# Patient Record
Sex: Female | Born: 1945
Health system: Southern US, Community
[De-identification: ages and names within clinical notes are randomized; demographics above are authoritative.]

## PROBLEM LIST (undated history)

## (undated) HISTORY — PX: EYE SURGERY: SHX253

---

## 2004-06-22 ENCOUNTER — Ambulatory Visit: Payer: Self-pay | Admitting: Family Medicine

## 2013-10-25 ENCOUNTER — Emergency Department: Payer: Self-pay | Admitting: Emergency Medicine

## 2013-10-25 LAB — COMPREHENSIVE METABOLIC PANEL
ALK PHOS: 78 U/L
ALT: 16 U/L (ref 12–78)
Albumin: 3.7 g/dL (ref 3.4–5.0)
Anion Gap: 10 (ref 7–16)
BUN: 18 mg/dL (ref 7–18)
Bilirubin,Total: 0.4 mg/dL (ref 0.2–1.0)
CHLORIDE: 109 mmol/L — AB (ref 98–107)
CO2: 21 mmol/L (ref 21–32)
Calcium, Total: 8.7 mg/dL (ref 8.5–10.1)
Creatinine: 1.55 mg/dL — ABNORMAL HIGH (ref 0.60–1.30)
EGFR (African American): 40 — ABNORMAL LOW
EGFR (Non-African Amer.): 35 — ABNORMAL LOW
Glucose: 170 mg/dL — ABNORMAL HIGH (ref 65–99)
OSMOLALITY: 285 (ref 275–301)
POTASSIUM: 3.7 mmol/L (ref 3.5–5.1)
SGOT(AST): 13 U/L — ABNORMAL LOW (ref 15–37)
Sodium: 140 mmol/L (ref 136–145)
TOTAL PROTEIN: 7.3 g/dL (ref 6.4–8.2)

## 2013-10-25 LAB — URINALYSIS, COMPLETE
Bilirubin,UR: NEGATIVE
GLUCOSE, UR: NEGATIVE mg/dL (ref 0–75)
KETONE: NEGATIVE
Nitrite: POSITIVE
PH: 5 (ref 4.5–8.0)
Protein: NEGATIVE
RBC,UR: 447 /HPF (ref 0–5)
Specific Gravity: 1.011 (ref 1.003–1.030)
WBC UR: 64 /HPF (ref 0–5)

## 2013-10-25 LAB — CBC WITH DIFFERENTIAL/PLATELET
BASOS ABS: 0 10*3/uL (ref 0.0–0.1)
Basophil %: 0.7 %
Eosinophil #: 0 10*3/uL (ref 0.0–0.7)
Eosinophil %: 0.7 %
HCT: 41.2 % (ref 35.0–47.0)
HGB: 13.9 g/dL (ref 12.0–16.0)
Lymphocyte #: 0.6 10*3/uL — ABNORMAL LOW (ref 1.0–3.6)
Lymphocyte %: 15.3 %
MCH: 30.7 pg (ref 26.0–34.0)
MCHC: 33.7 g/dL (ref 32.0–36.0)
MCV: 91 fL (ref 80–100)
Monocyte #: 0 x10 3/mm — ABNORMAL LOW (ref 0.2–0.9)
Monocyte %: 0.5 %
NEUTROS PCT: 82.8 %
Neutrophil #: 3.5 10*3/uL (ref 1.4–6.5)
Platelet: 206 10*3/uL (ref 150–440)
RBC: 4.52 10*6/uL (ref 3.80–5.20)
RDW: 13.2 % (ref 11.5–14.5)
WBC: 4.2 10*3/uL (ref 3.6–11.0)

## 2013-10-25 LAB — TROPONIN I: Troponin-I: <0.02 ng/mL

## 2013-10-25 LAB — LIPASE, BLOOD: Lipase: 229 U/L (ref 73–393)

## 2014-10-31 DIAGNOSIS — I10 Essential (primary) hypertension: Secondary | ICD-10-CM | POA: Diagnosis not present

## 2014-10-31 DIAGNOSIS — E785 Hyperlipidemia, unspecified: Secondary | ICD-10-CM | POA: Diagnosis not present

## 2014-10-31 DIAGNOSIS — E559 Vitamin D deficiency, unspecified: Secondary | ICD-10-CM | POA: Diagnosis not present

## 2014-10-31 DIAGNOSIS — Z1239 Encounter for other screening for malignant neoplasm of breast: Secondary | ICD-10-CM | POA: Diagnosis not present

## 2014-12-03 IMAGING — CT CT STONE STUDY
2 of 4 series · 15 of 46 positions shown, 17 images · non-contrast
Comparison: None.

CLINICAL DATA: Left side pain, vomiting

EXAM:
CT ABDOMEN AND PELVIS WITHOUT CONTRAST
TECHNIQUE: Multidetector CT imaging of the abdomen and pelvis was performed
following the standard protocol without IV contrast.

[Series 2: stone standard full · axial · 0.67mm/px · z∈[-339,+61]mm · 12 of 96 slices shown, 14 images]
[im 8/96  soft-tissue]
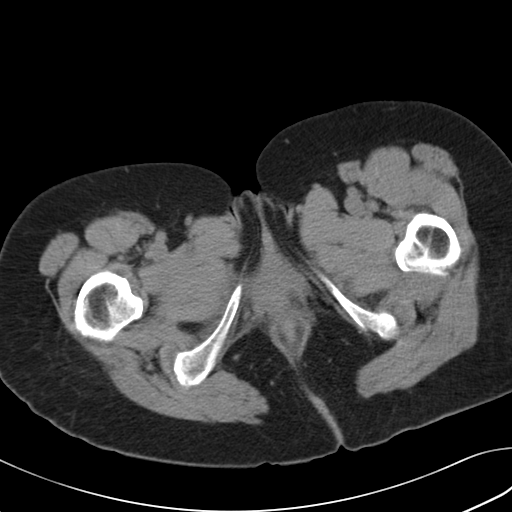
[im 8/96  bone]
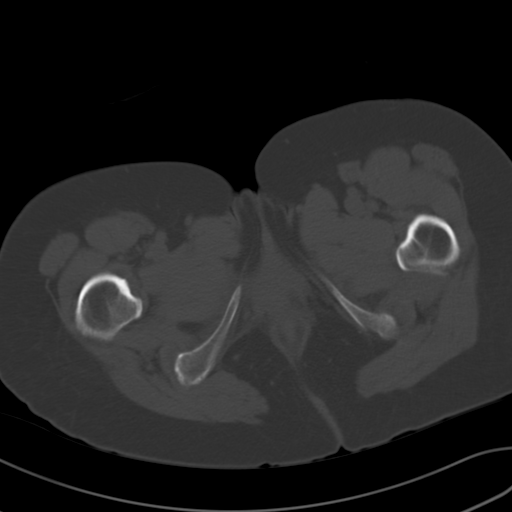
[im 15/96  soft-tissue]
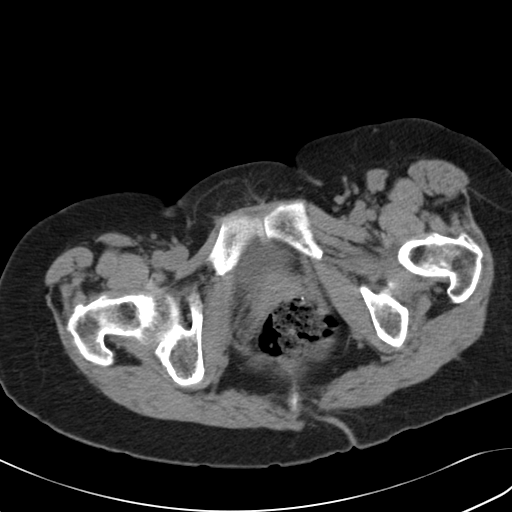
[im 22/96  soft-tissue]
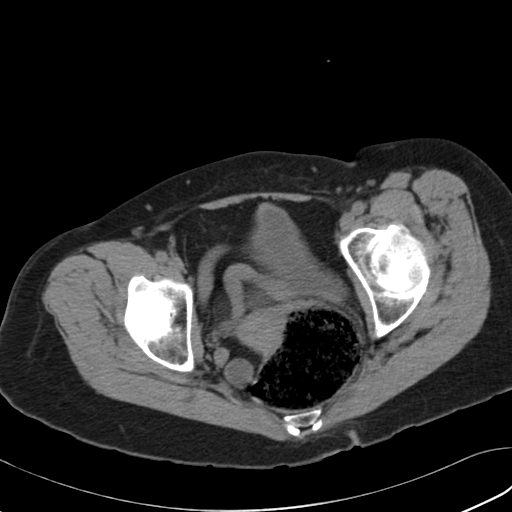
[im 30/96  soft-tissue]
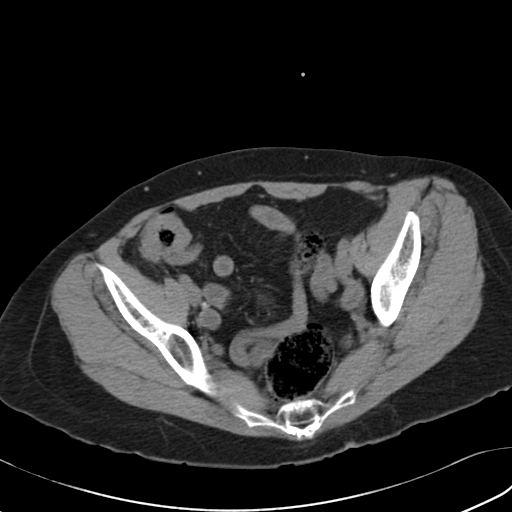
[im 37/96  soft-tissue]
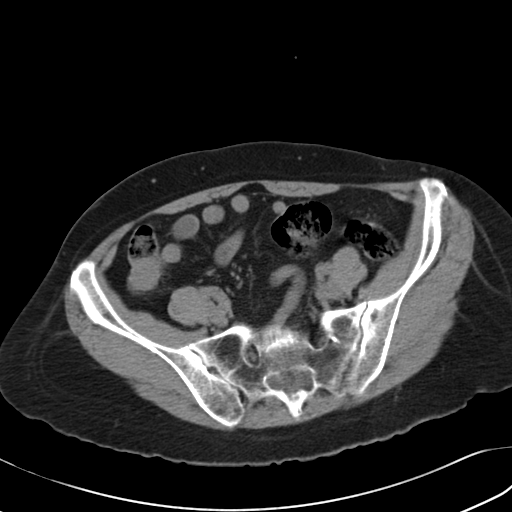
[im 44/96  soft-tissue]
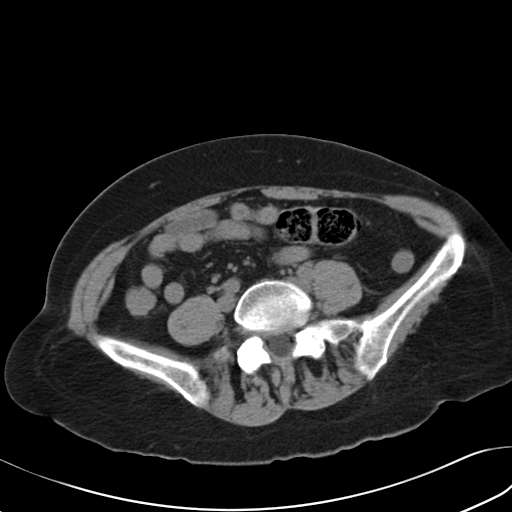
[im 52/96  soft-tissue]
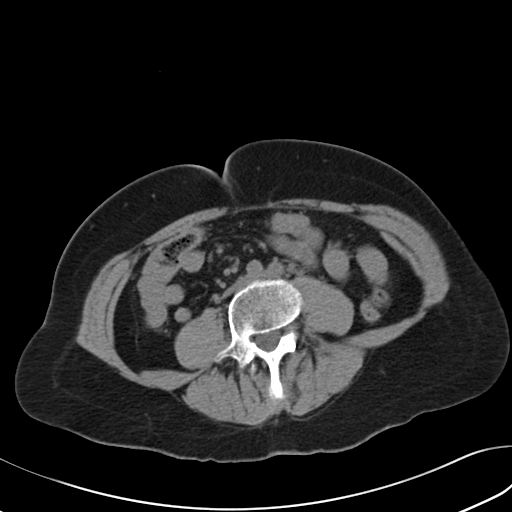
[im 59/96  soft-tissue]
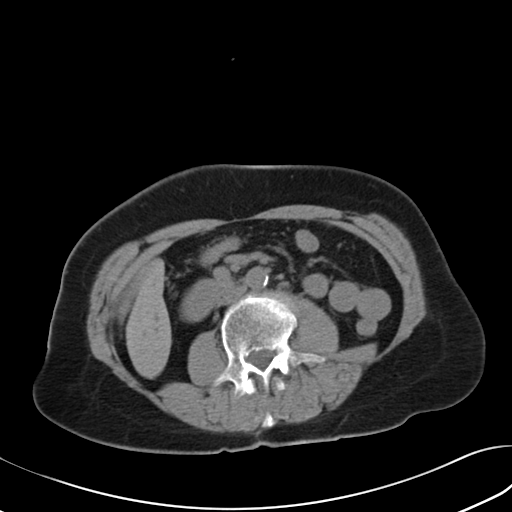
[im 66/96  soft-tissue]
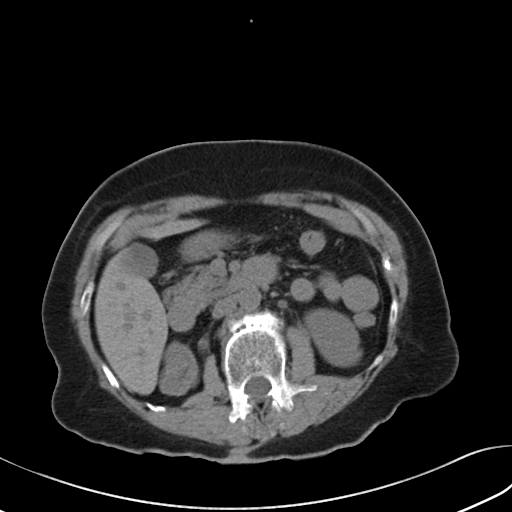
[im 66/96  bone]
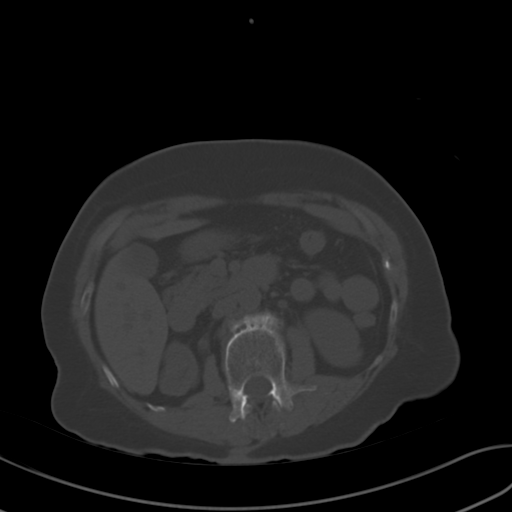
[im 74/96  soft-tissue]
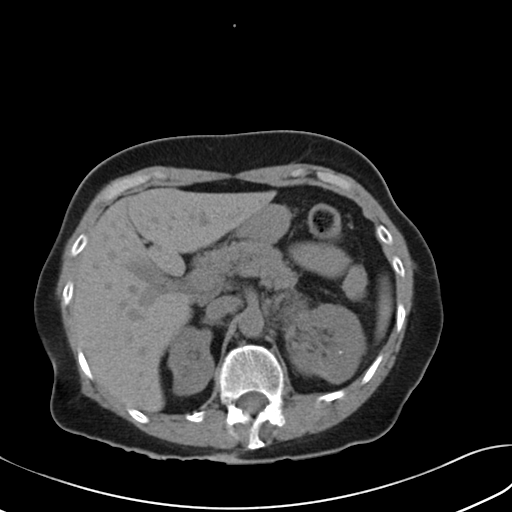
[im 81/96  soft-tissue]
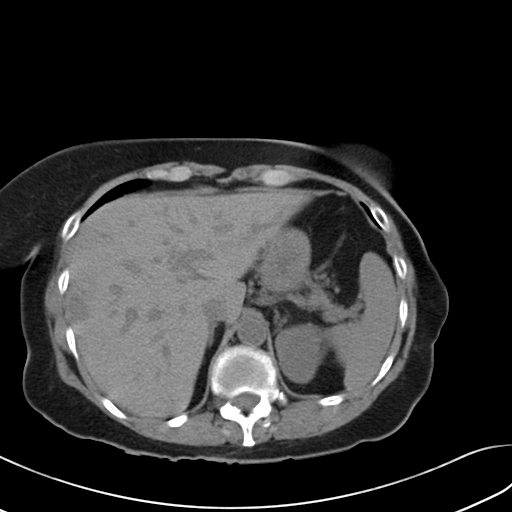
[im 88/96  soft-tissue]
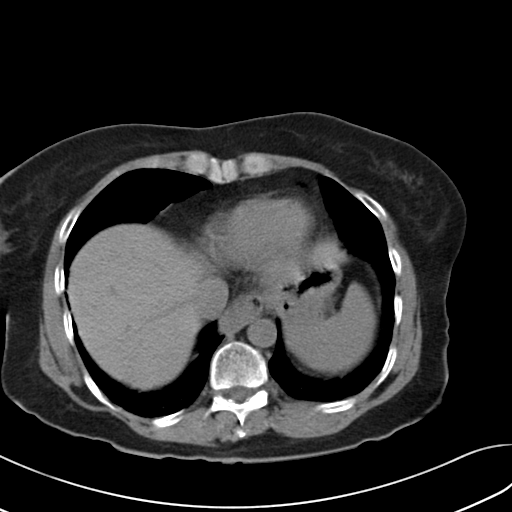

[Series 5: cor stone standard full · coronal · 0.69mm/px · 3 of 104 slices shown]
[im 35/104  soft-tissue]
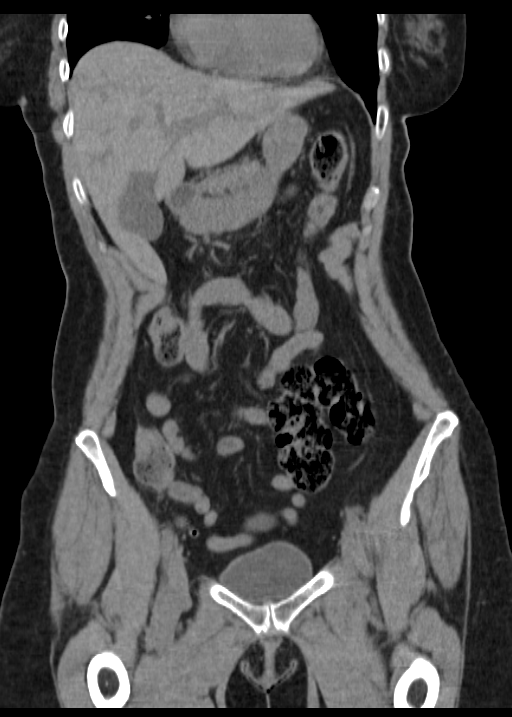
[im 46/104  soft-tissue]
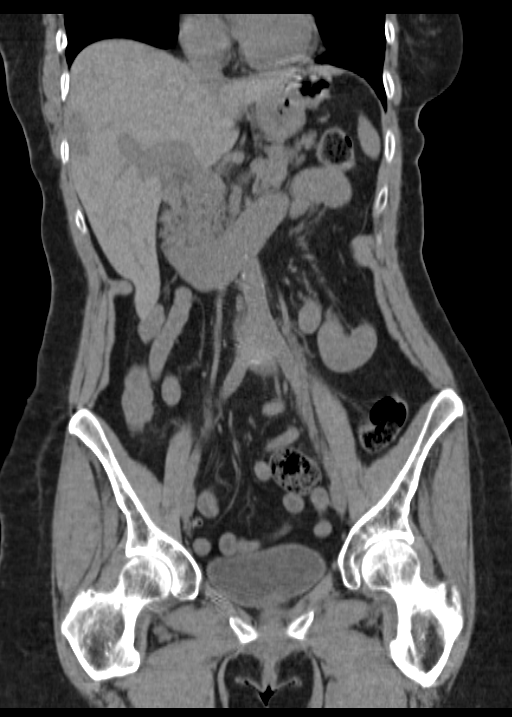
[im 58/104  soft-tissue]
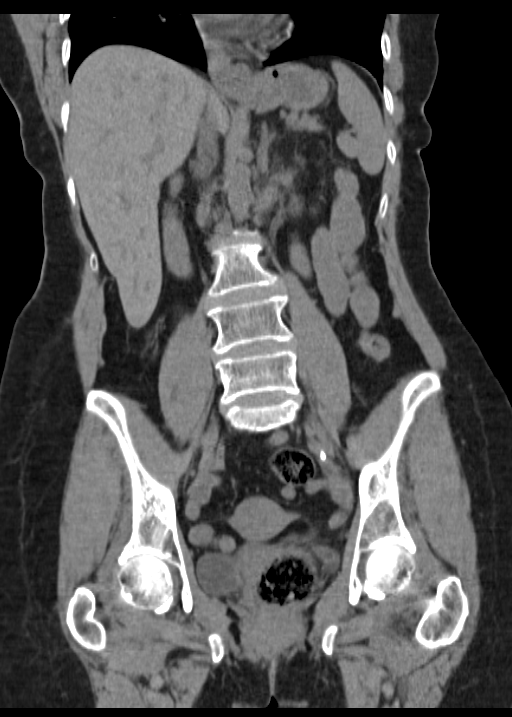

[15 of 46 positions shown; findings below may reference images not displayed]

FINDINGS: Sagittal images of the spine shows osteopenia and degenerative
changes lumbar spine. Significant disc space flattening with
anterior spurring at L1-L2 level.

Lung bases are unremarkable.

Unenhanced liver shows a probable cyst in lateral aspect of right
hepatic lobe measures 1.8 cm.

Small hiatal hernia is noted. There is significant cortical scarring
and there is cortical thinning right kidney. Multiple nonobstructive
calculi are noted within left kidney the largest in lower pole
measures 4 mm. There is cortical scarring in lower pole of the left
kidney. There is left perinephric and periureteral stranding. No
hydronephrosis or hydroureter. No proximal or mid ureteral calculi
are noted bilaterally.

No small bowel obstruction. Mild atherosclerotic calcifications of
abdominal aorta and iliac arteries. No ascites or free air. No
adenopathy. There is no pericecal inflammation. Normal appendix.

The uterus is atrophic. No adnexal masses noted. There is dilatation
of the rectum with stool up to 6.6 cm. This is highly suspicious for
fecal impaction. There is a punctate calcification just posterior to
urinary bladder and anterior to the rectum on the left side measures
about 1.5 mm. Although this may be vascular in nature a left UVJ
calculus cannot be excluded. There is no dilatation of distal ureter
bilaterally. At least 2 calcified calculi are noted in right
posterior aspect of the urinary bladder the largest measures 2.7 mm.
A calculus in left posterior aspect of the urinary bladder is
measures 2.5 mm. No pelvic ascites or adenopathy. No inguinal
adenopathy. No destructive bony lesions are noted within pelvis.
Degenerative changes bilateral SI joints.
IMPRESSION: 1. There is left nonobstructive nephrolithiasis. Cortical scarring
noted lower pole of the left kidney. There is left perinephric and
proximal left periureteral stranding.
2. There is cortical thinning and cortical scarring in right kidney.
The right kidney is smaller in size compared with the left kidney.
3. A probable cyst is noted in lateral aspect of right hepatic lobe
measures 1.8 cm.
4. Normal appendix.  No pericecal inflammation.
5. There is distension of the rectum with stool measures at least
6.6 cm in diameter suspicious for fecal impaction.
6. There is a punctate calcification just posterior to left aspect
of the urinary bladder measures 1.5 mm. Although may be vascular in
nature I cannot exclude a left UVJ stone. At least 3 calcified
calculi are noted within urinary bladder.
7. Degenerative changes lumbar spine and bilateral SI joints.

## 2016-07-28 DIAGNOSIS — J0140 Acute pansinusitis, unspecified: Secondary | ICD-10-CM | POA: Diagnosis not present

## 2017-10-12 ENCOUNTER — Emergency Department: Payer: Medicare HMO

## 2017-10-12 ENCOUNTER — Emergency Department
Admission: EM | Admit: 2017-10-12 | Discharge: 2017-10-12 | Disposition: A | Discharge status: Home / Self Care | Payer: Medicare HMO | Attending: Student in an Organized Health Care Education/Training Program | Admitting: Student in an Organized Health Care Education/Training Program

## 2017-10-12 ENCOUNTER — Other Ambulatory Visit: Payer: Self-pay

## 2017-10-12 DIAGNOSIS — R109 Unspecified abdominal pain: Secondary | ICD-10-CM | POA: Diagnosis not present

## 2017-10-12 DIAGNOSIS — N3001 Acute cystitis with hematuria: Secondary | ICD-10-CM | POA: Diagnosis not present

## 2017-10-12 DIAGNOSIS — R1012 Left upper quadrant pain: Secondary | ICD-10-CM | POA: Diagnosis present

## 2017-10-12 DIAGNOSIS — N3091 Cystitis, unspecified with hematuria: Secondary | ICD-10-CM | POA: Diagnosis not present

## 2017-10-12 LAB — URINALYSIS, COMPLETE (UACMP) WITH MICROSCOPIC
Bilirubin Urine: NEGATIVE
Glucose, UA: NEGATIVE mg/dL
Ketones, ur: 5 mg/dL — AB
Nitrite: POSITIVE — AB
PROTEIN: NEGATIVE mg/dL
Specific Gravity, Urine: 1.012 (ref 1.005–1.030)
pH: 5 (ref 5.0–8.0)

## 2017-10-12 LAB — COMPREHENSIVE METABOLIC PANEL
ALBUMIN: 4.6 g/dL (ref 3.5–5.0)
ALT: 29 U/L (ref 14–54)
ANION GAP: 10 (ref 5–15)
AST: 33 U/L (ref 15–41)
Alkaline Phosphatase: 66 U/L (ref 38–126)
BILIRUBIN TOTAL: 0.8 mg/dL (ref 0.3–1.2)
BUN: 22 mg/dL — AB (ref 6–20)
CO2: 21 mmol/L — AB (ref 22–32)
CREATININE: 1.43 mg/dL — AB (ref 0.44–1.00)
Calcium: 9.3 mg/dL (ref 8.9–10.3)
Chloride: 105 mmol/L (ref 101–111)
GFR calc Af Amer: 42 mL/min — ABNORMAL LOW (ref >60)
GFR calc non Af Amer: 36 mL/min — ABNORMAL LOW (ref >60)
GLUCOSE: 151 mg/dL — AB (ref 65–99)
Potassium: 3.7 mmol/L (ref 3.5–5.1)
Sodium: 136 mmol/L (ref 135–145)
TOTAL PROTEIN: 8.1 g/dL (ref 6.5–8.1)

## 2017-10-12 LAB — CBC WITH DIFFERENTIAL/PLATELET
BASOS PCT: 1 %
Basophils Absolute: 0.1 10*3/uL (ref 0–0.1)
Eosinophils Absolute: 0 10*3/uL (ref 0–0.7)
Eosinophils Relative: 0 %
HEMATOCRIT: 47.1 % — AB (ref 35.0–47.0)
HEMOGLOBIN: 15.4 g/dL (ref 12.0–16.0)
Lymphocytes Relative: 7 %
Lymphs Abs: 0.9 10*3/uL — ABNORMAL LOW (ref 1.0–3.6)
MCH: 30.5 pg (ref 26.0–34.0)
MCHC: 32.8 g/dL (ref 32.0–36.0)
MCV: 93 fL (ref 80.0–100.0)
MONOS PCT: 2 %
Monocytes Absolute: 0.2 10*3/uL (ref 0.2–0.9)
NEUTROS ABS: 11.3 10*3/uL — AB (ref 1.4–6.5)
NEUTROS PCT: 90 %
Platelets: 252 10*3/uL (ref 150–440)
RBC: 5.06 MIL/uL (ref 3.80–5.20)
RDW: 13.1 % (ref 11.5–14.5)
WBC: 12.5 10*3/uL — AB (ref 3.6–11.0)

## 2017-10-12 LAB — LIPASE, BLOOD: Lipase: 34 U/L (ref 11–51)

## 2017-10-12 MED ORDER — CEPHALEXIN 500 MG PO CAPS: 500.0000 mg | ORAL_CAPSULE | Freq: Three times a day (TID) | ORAL | 0 refills | Status: AC

## 2017-10-12 MED ORDER — CEFTRIAXONE SODIUM 1 G IJ SOLR
1.0000 g | Freq: Once | INTRAMUSCULAR | Status: AC
  Administered 2017-10-12: 1 g via INTRAVENOUS
  Filled 2017-10-12: qty 10

## 2017-10-12 MED ORDER — FENTANYL CITRATE (PF) 100 MCG/2ML IJ SOLN
50.0000 ug | INTRAMUSCULAR | Status: DC | PRN
  Administered 2017-10-12: 50 ug via INTRAVENOUS
  Filled 2017-10-12: qty 2

## 2017-10-12 MED ORDER — SODIUM CHLORIDE 0.9 % IV BOLUS (SEPSIS)
1000.0000 mL | Freq: Once | INTRAVENOUS | Status: AC
  Administered 2017-10-12: 1000 mL via INTRAVENOUS

## 2017-10-12 MED ORDER — ONDANSETRON HCL 4 MG/2ML IJ SOLN
4.0000 mg | Freq: Once | INTRAMUSCULAR | Status: AC
  Administered 2017-10-12: 4 mg via INTRAVENOUS
  Filled 2017-10-12: qty 2

## 2017-10-12 MED ORDER — PROMETHAZINE HCL 12.5 MG PO TABS: 12.5000 mg | ORAL_TABLET | Freq: Four times a day (QID) | ORAL | 0 refills | Status: DC | PRN

## 2017-10-12 MED ORDER — KETOROLAC TROMETHAMINE 30 MG/ML IJ SOLN
15.0000 mg | Freq: Once | INTRAMUSCULAR | Status: AC
  Administered 2017-10-12: 15 mg via INTRAVENOUS
  Filled 2017-10-12: qty 1

## 2017-10-12 NOTE — ED Provider Notes (Signed)
[facility] Emergency Department Provider Note    First MD Initiated Contact with Patient 10/12/17 1445     (approximate)  I have reviewed the triage vital signs and the nursing notes.   HISTORY  Chief Complaint Flank Pain    HPI Claudia Roth is a 72 y.o. female with a history of kidney stones presents with chief complaint of left-sided flank pain radiating down her left groin for the past day.  Does have a history of kidney stones and feels that this pain is somewhat similar.  Denies any fever or nausea.  No recent antibiotics.  Is not been taking Azo.  Has noted darker urine but no dysuria.  No constipation or diarrhea.  States the pain is mild to moderate in severity.  Patient presenting from home.  History reviewed. No pertinent past medical history. No family history on file. Past Surgical History:  Procedure Laterality Date  . EYE SURGERY     There are no active problems to display for this patient.     Prior to Admission medications   Not on File    Allergies Patient has no known allergies.    Social History Social History   Tobacco Use  . Smoking status: Never Smoker  . Smokeless tobacco: Never Used  Substance Use Topics  . Alcohol use: No    Frequency: Never  . Drug use: No    Review of Systems Patient denies headaches, rhinorrhea, blurry vision, numbness, shortness of breath, chest pain, edema, cough, abdominal pain, nausea, vomiting, diarrhea, dysuria, fevers, rashes or hallucinations unless otherwise stated above in HPI. ____________________________________________   PHYSICAL EXAM:  VITAL SIGNS: Vitals:   10/12/17 1325  BP: (!) 154/90  Pulse: 80  Resp: 18  Temp: 98 F (36.7 C)  SpO2: 100%    Constitutional: Alert and oriented. Well appearing and in no acute distress. Eyes: Conjunctivae are normal.  Head: Atraumatic. Nose: No congestion/rhinnorhea. Mouth/Throat: Mucous membranes are moist.   Neck: No  stridor. Painless ROM.  Cardiovascular: Normal rate, regular rhythm. Grossly normal heart sounds.  Good peripheral circulation. Respiratory: Normal respiratory effort.  No retractions. Lungs CTAB. Gastrointestinal: Soft and nontender. No distention. No abdominal bruits. +left  CVA tenderness. Genitourinary: deferred Musculoskeletal: No lower extremity tenderness nor edema.  No joint effusions. Neurologic:  Normal speech and language. No gross focal neurologic deficits are appreciated. No facial droop Skin:  Skin is warm, dry and intact. No rash noted. Psychiatric: Mood and affect are normal. Speech and behavior are normal.  ____________________________________________   LABS (all labs ordered are listed, but only abnormal results are displayed)  Results for orders placed or performed during the hospital encounter of 10/12/17 (from the past 24 hour(s))  Urinalysis, Complete w Microscopic     Status: Abnormal   Collection Time: 10/12/17  1:37 PM  Result Value Ref Range   Color, Urine YELLOW (A) YELLOW   APPearance HAZY (A) CLEAR   Specific Gravity, Urine 1.012 1.005 - 1.030   pH 5.0 5.0 - 8.0   Glucose, UA NEGATIVE NEGATIVE mg/dL   Hgb urine dipstick SMALL (A) NEGATIVE   Bilirubin Urine NEGATIVE NEGATIVE   Ketones, ur 5 (A) NEGATIVE mg/dL   Protein, ur NEGATIVE NEGATIVE mg/dL   Nitrite POSITIVE (A) NEGATIVE   Leukocytes, UA MODERATE (A) NEGATIVE   RBC / HPF 6-30 0 - 5 RBC/hpf   WBC, UA TOO NUMEROUS TO COUNT 0 - 5 WBC/hpf   Bacteria, UA RARE (A) NONE SEEN  Squamous Epithelial / LPF 0-5 (A) NONE SEEN   WBC Clumps PRESENT    Mucus PRESENT    ____________________________________________ __________________________________  RADIOLOGY  I personally reviewed all radiographic images ordered to evaluate for the above acute complaints and reviewed radiology reports and findings.  These findings were personally discussed with the patient.  Please see medical record for radiology  report.  ____________________________________________   PROCEDURES  Procedure(s) performed:  Procedures    Critical Care performed: no ____________________________________________   INITIAL IMPRESSION / ASSESSMENT AND PLAN / ED COURSE  Pertinent labs & imaging results that were available during my care of the patient were reviewed by me and considered in my medical decision making (see chart for details).  DDX: Stone, Pilo, UTI, muscular skeletal strain, perinephric abscess  Claudia Roth is a 72 y.o. who presents to the ED with left flank pain for the past day.  Patient afebrile Heema dynamically stable.  Patient in no acute distress.  Urinalysis does show nitrite positive and concerning for infection and given her history of stones will order CT imaging to exclude evidence of obstructing stone which would certainly complicate her presentation.  Will give IV fluids as well as IV pain medication and reassess.  Clinical Course as of Oct 13 1718  Wed Oct 12, 2017  1625 CT scan shows no hydronephrosis or evidence of obstructing ureteral stone at this point.  Patient likely passed a stone as there is a small 0.4 mm stone in the bladder.  Will give dose of IV antibiotics given her nitrite positive urine and rare bacteria.  Will give IV fluids and reassess.  [PR]  1700 Patient reassessed.  States symptoms have improved.  Tolerating antibiotics without any complication.  Patient has no evidence of obstructive process do believe patient stable and appropriate for outpatient follow-up with urology to be discharged on oral antibiotics.  [PR]    Clinical Course User Index [PR] Willy Eddy, MD     As part of my medical decision making, I reviewed the following data within the electronic MEDICAL RECORD NUMBER Nursing notes reviewed and incorporated, Labs reviewed, notes from prior ED visits   ____________________________________________   FINAL CLINICAL IMPRESSION(S) / ED  DIAGNOSES  Final diagnoses:  Left flank pain  Acute cystitis with hematuria      NEW MEDICATIONS STARTED DURING THIS VISIT:  New Prescriptions   No medications on file     Note:  This document was prepared using Dragon voice recognition software and may include unintentional dictation errors.    Willy Eddy, MD 10/12/17 1721

## 2017-10-12 NOTE — ED Triage Notes (Signed)
Pt in via POV with complaints of left side flank pain x one day.  Pt denies any urinary symptoms, reports associated N/V.  Vitals WDL, NAD noted at this time.

## 2017-10-12 NOTE — Discharge Instructions (Signed)

## 2018-03-22 DIAGNOSIS — E785 Hyperlipidemia, unspecified: Secondary | ICD-10-CM | POA: Diagnosis not present

## 2018-03-22 DIAGNOSIS — Z23 Encounter for immunization: Secondary | ICD-10-CM | POA: Diagnosis not present

## 2018-03-22 DIAGNOSIS — Z0000 Encounter for general adult medical examination without abnormal findings: Secondary | ICD-10-CM | POA: Diagnosis not present

## 2018-03-22 DIAGNOSIS — I10 Essential (primary) hypertension: Secondary | ICD-10-CM | POA: Diagnosis not present

## 2018-03-22 DIAGNOSIS — E559 Vitamin D deficiency, unspecified: Secondary | ICD-10-CM | POA: Diagnosis not present

## 2018-03-28 ENCOUNTER — Other Ambulatory Visit: Payer: Self-pay | Admitting: Family Medicine

## 2018-03-28 DIAGNOSIS — Z1231 Encounter for screening mammogram for malignant neoplasm of breast: Secondary | ICD-10-CM

## 2018-04-04 DIAGNOSIS — Z1231 Encounter for screening mammogram for malignant neoplasm of breast: Secondary | ICD-10-CM | POA: Diagnosis not present

## 2018-07-11 DIAGNOSIS — E785 Hyperlipidemia, unspecified: Secondary | ICD-10-CM | POA: Diagnosis not present

## 2018-07-11 DIAGNOSIS — E559 Vitamin D deficiency, unspecified: Secondary | ICD-10-CM | POA: Diagnosis not present

## 2018-09-26 DIAGNOSIS — E785 Hyperlipidemia, unspecified: Secondary | ICD-10-CM | POA: Diagnosis not present

## 2018-09-26 DIAGNOSIS — I10 Essential (primary) hypertension: Secondary | ICD-10-CM | POA: Diagnosis not present

## 2019-05-09 DIAGNOSIS — I10 Essential (primary) hypertension: Secondary | ICD-10-CM | POA: Diagnosis not present

## 2019-05-17 DIAGNOSIS — I10 Essential (primary) hypertension: Secondary | ICD-10-CM | POA: Diagnosis not present

## 2019-05-17 DIAGNOSIS — Z0000 Encounter for general adult medical examination without abnormal findings: Secondary | ICD-10-CM | POA: Diagnosis not present

## 2019-05-17 DIAGNOSIS — Z23 Encounter for immunization: Secondary | ICD-10-CM | POA: Diagnosis not present

## 2019-05-17 DIAGNOSIS — E785 Hyperlipidemia, unspecified: Secondary | ICD-10-CM | POA: Diagnosis not present

## 2019-05-18 ENCOUNTER — Other Ambulatory Visit: Payer: Self-pay | Admitting: Family Medicine

## 2019-05-18 DIAGNOSIS — Z1231 Encounter for screening mammogram for malignant neoplasm of breast: Secondary | ICD-10-CM

## 2019-09-20 ENCOUNTER — Ambulatory Visit: Payer: Medicare HMO | Admitting: Podiatry

## 2019-09-20 ENCOUNTER — Other Ambulatory Visit: Payer: Self-pay

## 2019-09-20 ENCOUNTER — Encounter: Payer: Self-pay | Admitting: Podiatry

## 2019-09-20 DIAGNOSIS — L84 Corns and callosities: Secondary | ICD-10-CM | POA: Diagnosis not present

## 2019-09-20 DIAGNOSIS — M2012 Hallux valgus (acquired), left foot: Secondary | ICD-10-CM | POA: Diagnosis not present

## 2019-09-20 DIAGNOSIS — M2042 Other hammer toe(s) (acquired), left foot: Secondary | ICD-10-CM | POA: Diagnosis not present

## 2019-09-20 NOTE — Progress Notes (Signed)
This patient presents the office with chief complaint of a painful corn on the second toe left foot.  She says that this corn has been present for over 5 years.  This patient has self treated herself with callus pads and patches which provide minimal relief.  Patient states that she previously had surgery for bunion correction left foot as well as a hammertoe correction second toe left foot.  Patient states this corn is painful walking and wearing her shoes.  She presents the office today for an evaluation and treatment of this painful corn.  Vascular  Dorsalis pedis  are palpable  B/L. Posterior tibial pulses are weakly palpable  B/L. Capillary return  WNL.  Temperature gradient is  WNL.  Skin turgor  WNL  Sensorium  Senn Weinstein monofilament wire  diminished . Normal tactile sensation.  Nail Exam  Patient has normal nails with no evidence of bacterial or fungal infection.  Orthopedic  Exam  Muscle tone and muscle strength  WNL.  No limitations of motion feet  B/L.  No crepitus or joint effusion noted.  HAV 1st MPJ  Left foot.  Hammer toe second toe left which is fused from previous surgery.  Skin  No open lesions.  Normal skin texture and turgor.  Corn 1/2 left foot  Corn secondary second toe left foot.  HAV 1st MPJ  Left foot.  IE.  Debride corn second toe left foot.  Padding was dispensed.  Discussed future surgery in future.  RTC prn.   Helane Gunther DPM

## 2020-05-14 DIAGNOSIS — E785 Hyperlipidemia, unspecified: Secondary | ICD-10-CM | POA: Diagnosis not present

## 2020-05-14 DIAGNOSIS — I10 Essential (primary) hypertension: Secondary | ICD-10-CM | POA: Diagnosis not present

## 2020-05-20 DIAGNOSIS — Z0000 Encounter for general adult medical examination without abnormal findings: Secondary | ICD-10-CM | POA: Diagnosis not present

## 2020-05-20 DIAGNOSIS — E785 Hyperlipidemia, unspecified: Secondary | ICD-10-CM | POA: Diagnosis not present

## 2020-05-20 DIAGNOSIS — I10 Essential (primary) hypertension: Secondary | ICD-10-CM | POA: Diagnosis not present

## 2022-04-13 DIAGNOSIS — L304 Erythema intertrigo: Secondary | ICD-10-CM | POA: Diagnosis not present

## 2023-09-01 DIAGNOSIS — N1831 Chronic kidney disease, stage 3a: Secondary | ICD-10-CM | POA: Diagnosis not present

## 2023-09-01 DIAGNOSIS — E785 Hyperlipidemia, unspecified: Secondary | ICD-10-CM | POA: Diagnosis not present

## 2023-09-01 DIAGNOSIS — I10 Essential (primary) hypertension: Secondary | ICD-10-CM | POA: Diagnosis not present

## 2023-09-01 DIAGNOSIS — Z1211 Encounter for screening for malignant neoplasm of colon: Secondary | ICD-10-CM | POA: Diagnosis not present

## 2023-09-01 DIAGNOSIS — B372 Candidiasis of skin and nail: Secondary | ICD-10-CM | POA: Diagnosis not present

## 2023-09-02 ENCOUNTER — Other Ambulatory Visit (HOSPITAL_COMMUNITY): Payer: Self-pay

## 2023-09-02 MED ORDER — ROSUVASTATIN CALCIUM 5 MG PO TABS
5.0000 mg | ORAL_TABLET | ORAL | 3 refills | Status: AC
  Filled 2023-09-02 – 2023-09-08 (×2): qty 45, 90d supply, fill #0
  Filled 2023-12-01: qty 45, 90d supply, fill #1

## 2023-09-02 MED ORDER — AMLODIPINE BESYLATE 5 MG PO TABS
5.0000 mg | ORAL_TABLET | Freq: Every day | ORAL | 3 refills | Status: AC
  Filled 2023-09-02 – 2023-09-08 (×2): qty 90, 90d supply, fill #0
  Filled 2023-12-01: qty 90, 90d supply, fill #1
  Filled 2024-03-06: qty 90, 90d supply, fill #2
  Filled 2024-06-06: qty 90, 90d supply, fill #3

## 2023-09-08 ENCOUNTER — Other Ambulatory Visit: Payer: Self-pay

## 2023-09-08 ENCOUNTER — Other Ambulatory Visit (HOSPITAL_COMMUNITY): Payer: Self-pay

## 2023-09-10 ENCOUNTER — Other Ambulatory Visit (HOSPITAL_COMMUNITY): Payer: Self-pay

## 2023-10-27 DIAGNOSIS — Z1231 Encounter for screening mammogram for malignant neoplasm of breast: Secondary | ICD-10-CM | POA: Diagnosis not present

## 2023-11-29 DIAGNOSIS — I10 Essential (primary) hypertension: Secondary | ICD-10-CM | POA: Diagnosis not present

## 2023-11-29 DIAGNOSIS — Z0000 Encounter for general adult medical examination without abnormal findings: Secondary | ICD-10-CM | POA: Diagnosis not present

## 2023-11-29 DIAGNOSIS — N1831 Chronic kidney disease, stage 3a: Secondary | ICD-10-CM | POA: Diagnosis not present

## 2023-11-29 DIAGNOSIS — E559 Vitamin D deficiency, unspecified: Secondary | ICD-10-CM | POA: Diagnosis not present

## 2023-11-29 DIAGNOSIS — E785 Hyperlipidemia, unspecified: Secondary | ICD-10-CM | POA: Diagnosis not present

## 2023-12-01 ENCOUNTER — Other Ambulatory Visit (HOSPITAL_COMMUNITY): Payer: Self-pay

## 2024-01-17 ENCOUNTER — Other Ambulatory Visit (HOSPITAL_COMMUNITY): Payer: Self-pay

## 2024-01-17 MED ORDER — ROSUVASTATIN CALCIUM 5 MG PO TABS
5.0000 mg | ORAL_TABLET | Freq: Every day | ORAL | 3 refills | Status: AC
  Filled 2024-01-23 – 2024-02-08 (×3): qty 90, 90d supply, fill #0
  Filled 2024-05-07: qty 90, 90d supply, fill #1
  Filled 2024-08-06: qty 90, 90d supply, fill #2

## 2024-01-23 ENCOUNTER — Other Ambulatory Visit (HOSPITAL_COMMUNITY): Payer: Self-pay

## 2024-02-07 ENCOUNTER — Other Ambulatory Visit: Payer: Self-pay

## 2024-02-07 ENCOUNTER — Other Ambulatory Visit (HOSPITAL_COMMUNITY): Payer: Self-pay

## 2024-02-08 ENCOUNTER — Other Ambulatory Visit (HOSPITAL_COMMUNITY): Payer: Self-pay

## 2024-02-24 DIAGNOSIS — E785 Hyperlipidemia, unspecified: Secondary | ICD-10-CM | POA: Diagnosis not present

## 2024-03-06 ENCOUNTER — Other Ambulatory Visit: Payer: Self-pay

## 2024-05-07 ENCOUNTER — Other Ambulatory Visit (HOSPITAL_COMMUNITY): Payer: Self-pay

## 2024-06-06 ENCOUNTER — Other Ambulatory Visit (HOSPITAL_COMMUNITY): Payer: Self-pay

## 2024-08-06 ENCOUNTER — Other Ambulatory Visit (HOSPITAL_COMMUNITY): Payer: Self-pay
